# Patient Record
Sex: Male | Born: 1945 | Race: White | Hispanic: No | Smoking: Never smoker
Health system: Southern US, Community
[De-identification: ages and names within clinical notes are randomized; demographics above are authoritative.]

## PROBLEM LIST (undated history)

## (undated) DIAGNOSIS — C61 Malignant neoplasm of prostate: Secondary | ICD-10-CM

## (undated) DIAGNOSIS — E119 Type 2 diabetes mellitus without complications: Secondary | ICD-10-CM

## (undated) DIAGNOSIS — E7800 Pure hypercholesterolemia, unspecified: Secondary | ICD-10-CM

## (undated) HISTORY — DX: Malignant neoplasm of prostate: C61

---

## 2008-12-06 DEATH — deceased

## 2011-02-15 ENCOUNTER — Ambulatory Visit: Payer: Self-pay | Admitting: Family Medicine

## 2011-03-12 ENCOUNTER — Encounter: Payer: Self-pay | Admitting: Family Medicine

## 2011-03-12 ENCOUNTER — Other Ambulatory Visit: Payer: Self-pay | Admitting: Family Medicine

## 2011-03-12 ENCOUNTER — Ambulatory Visit (INDEPENDENT_AMBULATORY_CARE_PROVIDER_SITE_OTHER): Payer: 59 | Admitting: Family Medicine

## 2011-03-12 DIAGNOSIS — E785 Hyperlipidemia, unspecified: Secondary | ICD-10-CM

## 2011-03-12 DIAGNOSIS — E119 Type 2 diabetes mellitus without complications: Secondary | ICD-10-CM

## 2011-03-12 DIAGNOSIS — K219 Gastro-esophageal reflux disease without esophagitis: Secondary | ICD-10-CM

## 2011-03-12 DIAGNOSIS — N400 Enlarged prostate without lower urinary tract symptoms: Secondary | ICD-10-CM

## 2011-03-12 MED ORDER — ESOMEPRAZOLE MAGNESIUM 40 MG PO CPDR: 40.0000 mg | DELAYED_RELEASE_CAPSULE | Freq: Every day | ORAL | Status: DC

## 2011-03-12 NOTE — Patient Instructions (Signed)
We will call you once I get your records and can go through them

## 2011-03-13 ENCOUNTER — Telehealth: Payer: Self-pay | Admitting: *Deleted

## 2011-03-13 DIAGNOSIS — E119 Type 2 diabetes mellitus without complications: Secondary | ICD-10-CM | POA: Insufficient documentation

## 2011-03-13 DIAGNOSIS — E785 Hyperlipidemia, unspecified: Secondary | ICD-10-CM | POA: Insufficient documentation

## 2011-03-13 DIAGNOSIS — K219 Gastro-esophageal reflux disease without esophagitis: Secondary | ICD-10-CM | POA: Insufficient documentation

## 2011-03-13 DIAGNOSIS — N400 Enlarged prostate without lower urinary tract symptoms: Secondary | ICD-10-CM | POA: Insufficient documentation

## 2011-03-13 NOTE — Progress Notes (Signed)
  Subjective:    Patient ID: Rickey Roberts, male    DOB: 19-Aug-1946, 65 y.o.   MRN: [id number]  HPI Patient is here to establish care. He says the last time he saw his regular physician was approximately 2 years ago. At that time they were doing something with his thyroid and he was supposed to be scheduled for surgery. He did end up seeing a physician [name] at that time I think something happened with his insurance and he was unable to have the surgery. As patient describes his previous concerns I must wonder if he is talking about his parathyroid glands. We will certainly try to get a copy of his old records to see exactly what is needed. He did say he recently went to an urgent care type facility [place] and did have some lab work drawn which he says should be sent up here. Otherwise he is feeling okay and he does not have any other particular concerns.  Diabetes- He did note he is used to be on metformin 1000 twice a day but recently after being restarted on his metformin is only taking 1500 mg daily.   Review of Systems     Objective:   Physical Exam  Constitutional: He is oriented to person, place, and time. He appears well-developed and well-nourished.  HENT:  Head: Normocephalic and atraumatic.  Eyes: Conjunctivae and EOM are normal. Pupils are equal, round, and reactive to light.  Neck: Neck supple. No thyromegaly present.  Cardiovascular: Normal rate, regular rhythm and normal heart sounds.        No carotid bruits, radial pulse 2+ bilaterally.  Pulmonary/Chest: Effort normal and breath sounds normal.  Musculoskeletal: He exhibits no edema.  Lymphadenopathy:    He has no cervical adenopathy.  Neurological: He is alert and oriented to person, place, and time.  Skin: Skin is warm. No rash noted.  Psychiatric: He has a normal mood and affect. His behavior is normal.          Assessment & Plan:  She is talking about his thyroid or possibly even his parathyroids.  [facility] records and get him scheduled immediately for whatever is the next Depo-Provera every needs whether that seeing a surgeon again repeat imaging since it has been 2 years. The patient has signed a release form today so we should hopefully get that promptly. His blood pressure is borderline. It really needs to be little bit better controlled since he is a diabetic but certainly we can work on this. I also don't know the exact dosages of his medications and he said he will get a copy of this to [place], so that we can make adjustments. Since he only recently restarted his medications we may need to do some blood work in the next 2-3 months to make sure that his A1c is at goal and his cholesterol is well controlled.  GERD he did asked for refill on his Nexium I did send her a prescription for this.  Note: Additional medical hx if not included at pt forgot to leave his new pt form.

## 2011-03-13 NOTE — Telephone Encounter (Signed)
Yes, OK to change to omeprazole

## 2011-03-13 NOTE — Telephone Encounter (Signed)
Called and changed rx with pharm

## 2011-03-13 NOTE — Telephone Encounter (Signed)
[facility] called and wanted to know if we can writes a different rx for pt because insurance wont cover nexium

## 2011-04-03 ENCOUNTER — Telehealth: Payer: Self-pay | Admitting: Family Medicine

## 2011-04-03 NOTE — Telephone Encounter (Signed)
Call pt" I got his old records. We do need to recheck his PTH and calcium and vit D, and see where his levels are. If still abnormal then will need to get him referred by to [facility].

## 2011-04-04 ENCOUNTER — Other Ambulatory Visit: Payer: 59

## 2011-04-04 DIAGNOSIS — R899 Unspecified abnormal finding in specimens from other organs, systems and tissues: Secondary | ICD-10-CM

## 2011-04-04 NOTE — Telephone Encounter (Signed)
Left message on vm and printed and faxed lab orders to lab.

## 2011-04-24 ENCOUNTER — Other Ambulatory Visit: Payer: Self-pay | Admitting: Family Medicine

## 2011-06-04 ENCOUNTER — Ambulatory Visit: Payer: 59 | Admitting: Family Medicine

## 2011-06-04 ENCOUNTER — Other Ambulatory Visit: Payer: Self-pay | Admitting: Family Medicine

## 2011-06-05 ENCOUNTER — Telehealth: Payer: Self-pay | Admitting: Family Medicine

## 2011-06-05 NOTE — Telephone Encounter (Signed)
Pharmacist [facility] for the triage nurse asking to get 90 day supply for pt meds:  Simvastatin, flomax, and metformin. Plan:  Reviewed the pt chart, called the pharmacist back, and told him pt was told at last office visit two mths ago he would need to return in 2 mths which is due now for office visit and labs and complete list of medications with dosing instructions because provider said since not controlled may need adjustments. Pharmacist will tell the pt and it looks like pt already was given refills enough for 30 day supplies of these meds. Jarvis Newcomer, LPN [facility]

## 2011-07-18 ENCOUNTER — Other Ambulatory Visit: Payer: Self-pay | Admitting: Family Medicine

## 2011-08-17 ENCOUNTER — Other Ambulatory Visit: Payer: Self-pay | Admitting: Family Medicine

## 2011-09-14 ENCOUNTER — Other Ambulatory Visit: Payer: Self-pay | Admitting: Family Medicine

## 2011-10-16 ENCOUNTER — Other Ambulatory Visit: Payer: Self-pay | Admitting: Family Medicine

## 2011-11-13 ENCOUNTER — Other Ambulatory Visit: Payer: Self-pay | Admitting: Family Medicine

## 2011-12-16 ENCOUNTER — Other Ambulatory Visit: Payer: Self-pay | Admitting: Family Medicine

## 2011-12-17 NOTE — Telephone Encounter (Signed)
Must make appointment 

## 2012-01-14 ENCOUNTER — Other Ambulatory Visit: Payer: Self-pay | Admitting: Family Medicine

## 2015-04-19 ENCOUNTER — Emergency Department (HOSPITAL_BASED_OUTPATIENT_CLINIC_OR_DEPARTMENT_OTHER): Payer: Medicare Other

## 2015-04-19 ENCOUNTER — Emergency Department (HOSPITAL_BASED_OUTPATIENT_CLINIC_OR_DEPARTMENT_OTHER)
Admission: EM | Admit: 2015-04-19 | Discharge: 2015-04-19 | Disposition: A | Discharge status: Home / Self Care | Payer: Medicare Other | Attending: Emergency Medicine | Admitting: Emergency Medicine

## 2015-04-19 ENCOUNTER — Encounter (HOSPITAL_BASED_OUTPATIENT_CLINIC_OR_DEPARTMENT_OTHER): Payer: Self-pay

## 2015-04-19 DIAGNOSIS — R51 Headache: Secondary | ICD-10-CM | POA: Insufficient documentation

## 2015-04-19 DIAGNOSIS — E119 Type 2 diabetes mellitus without complications: Secondary | ICD-10-CM | POA: Diagnosis not present

## 2015-04-19 DIAGNOSIS — H5712 Ocular pain, left eye: Secondary | ICD-10-CM | POA: Diagnosis not present

## 2015-04-19 DIAGNOSIS — Z7982 Long term (current) use of aspirin: Secondary | ICD-10-CM | POA: Diagnosis not present

## 2015-04-19 DIAGNOSIS — Z79899 Other long term (current) drug therapy: Secondary | ICD-10-CM | POA: Diagnosis not present

## 2015-04-19 DIAGNOSIS — E780 Pure hypercholesterolemia: Secondary | ICD-10-CM | POA: Insufficient documentation

## 2015-04-19 DIAGNOSIS — Z8546 Personal history of malignant neoplasm of prostate: Secondary | ICD-10-CM | POA: Diagnosis not present

## 2015-04-19 DIAGNOSIS — R519 Headache, unspecified: Secondary | ICD-10-CM

## 2015-04-19 HISTORY — DX: Type 2 diabetes mellitus without complications: E11.9

## 2015-04-19 HISTORY — DX: Pure hypercholesterolemia, unspecified: E78.00

## 2015-04-19 LAB — COMPREHENSIVE METABOLIC PANEL
ALK PHOS: 44 U/L (ref 38–126)
ALT: 17 U/L (ref 17–63)
AST: 20 U/L (ref 15–41)
Albumin: 4.2 g/dL (ref 3.5–5.0)
Anion gap: 6 (ref 5–15)
BILIRUBIN TOTAL: 1.1 mg/dL (ref 0.3–1.2)
BUN: 18 mg/dL (ref 6–20)
CHLORIDE: 103 mmol/L (ref 101–111)
CO2: 29 mmol/L (ref 22–32)
Calcium: 10.3 mg/dL (ref 8.9–10.3)
Creatinine, Ser: 0.96 mg/dL (ref 0.61–1.24)
GFR calc Af Amer: >60 mL/min (ref >60)
GFR calc non Af Amer: >60 mL/min (ref >60)
Glucose, Bld: 130 mg/dL — ABNORMAL HIGH (ref 65–99)
Potassium: 4 mmol/L (ref 3.5–5.1)
SODIUM: 138 mmol/L (ref 135–145)
TOTAL PROTEIN: 7.8 g/dL (ref 6.5–8.1)

## 2015-04-19 LAB — CBC WITH DIFFERENTIAL/PLATELET
BASOS ABS: 0 10*3/uL (ref 0.0–0.1)
Basophils Relative: 0 % (ref 0–1)
EOS ABS: 0.1 10*3/uL (ref 0.0–0.7)
EOS PCT: 1 % (ref 0–5)
HCT: 43 % (ref 39.0–52.0)
HEMOGLOBIN: 14.4 g/dL (ref 13.0–17.0)
Lymphocytes Relative: 15 % (ref 12–46)
Lymphs Abs: 1.2 10*3/uL (ref 0.7–4.0)
MCH: 29.8 pg (ref 26.0–34.0)
MCHC: 33.5 g/dL (ref 30.0–36.0)
MCV: 89 fL (ref 78.0–100.0)
MONO ABS: 0.5 10*3/uL (ref 0.1–1.0)
Monocytes Relative: 6 % (ref 3–12)
NEUTROS PCT: 78 % — AB (ref 43–77)
Neutro Abs: 6.5 10*3/uL (ref 1.7–7.7)
PLATELETS: 265 10*3/uL (ref 150–400)
RBC: 4.83 MIL/uL (ref 4.22–5.81)
RDW: 13.7 % (ref 11.5–15.5)
WBC: 8.3 10*3/uL (ref 4.0–10.5)

## 2015-04-19 LAB — SEDIMENTATION RATE: Sed Rate: 3 mm/hr (ref 0–16)

## 2015-04-19 MED ORDER — HYDROCODONE-ACETAMINOPHEN 5-325 MG PO TABS
ORAL_TABLET | ORAL | Status: AC
  Administered 2015-04-19: 1
  Filled 2015-04-19: qty 1

## 2015-04-19 MED ORDER — TETRACAINE HCL 0.5 % OP SOLN
OPHTHALMIC | Status: AC
  Filled 2015-04-19: qty 2

## 2015-04-19 MED ORDER — HYDROCODONE-ACETAMINOPHEN 5-325 MG PO TABS: 1.0000 | ORAL_TABLET | Freq: Four times a day (QID) | ORAL | Status: AC | PRN

## 2015-04-19 NOTE — ED Notes (Signed)
Pt c/o headache x3wks, states getting worse tonight, pain over lt eye area

## 2015-04-19 NOTE — ED Provider Notes (Signed)
CSN: [id number]     Arrival date & time 04/19/15  3825 History   First MD Initiated Contact with Patient 04/19/15 0353     Chief Complaint  Patient presents with  . Headache     (Consider location/radiation/quality/duration/timing/severity/associated sxs/prior Treatment) HPI  This is a 69 year old male who complains of a headache for the past 3 weeks. The pain is come on gradually and is worsening. It is now severe enough that it prevents him from sleeping. He localizes the pain to the left eye. It is as if his left globe hurts. His vision is not changed. There is no facial tenderness. The pain is constant and not relieved by over-the-counter analgesics. He denies nausea or vomiting. He denies neurologic changes. He has a remote history of left tympanic membrane rupture.  Past Medical History  Diagnosis Date  . Prostate cancer     radiation  . High cholesterol   . Diabetes mellitus without complication    History reviewed. No pertinent past surgical history. No family history on file. History  Substance Use Topics  . Smoking status: Never Smoker   . Smokeless tobacco: Not on file  . Alcohol Use: No    Review of Systems  All other systems reviewed and are negative.   Allergies  Review of patient's allergies indicates no known allergies.  Home Medications   Prior to Admission medications   Medication Sig Start Date End Date Taking? Authorizing Provider  aspirin 81 MG chewable tablet Chew by mouth daily.   Yes Historical Provider, MD  glipiZIDE (GLUCOTROL) 10 MG tablet Take 10 mg by mouth daily before breakfast.   Yes Historical Provider, MD  losartan (COZAAR) 25 MG tablet Take 25 mg by mouth daily.   Yes Historical Provider, MD  metFORMIN (GLUCOPHAGE) 500 MG tablet Take 500 mg by mouth 3 (three) times daily.      Historical Provider, MD  metFORMIN (GLUCOPHAGE-XR) 500 MG 24 hr tablet TAKE ONE TABLET BY MOUTH TWICE DAILY 01/14/12   Hali Marry, MD  NEXIUM 40 MG  capsule TAKE ONE CAPSULE BY MOUTH DAILY 03/12/11   Hali Marry, MD  simvastatin (ZOCOR) 40 MG tablet TAKE 1 TABLET BY MOUTH EVERY DAY 06/04/11   Hali Marry, MD  SIMVASTATIN PO Take by mouth.      Historical Provider, MD  Tamsulosin HCl (FLOMAX PO) Take by mouth.      Historical Provider, MD  Tamsulosin HCl (FLOMAX) 0.4 MG CAPS TAKE ONE CAPSULE BY MOUTH EVERY NIGHT AT BEDTIME 06/04/11   Hali Marry, MD   BP 169/85 mmHg  Pulse 74  Temp(Src) 98.4 F (36.9 C) (Oral)  Resp 20  Ht 5\' 8"  (1.727 m)  Wt 176 lb (79.833 kg)  BMI 26.77 kg/m2  SpO2 98%   Physical Exam  General: Well-developed, well-nourished male in no acute distress; appearance consistent with age of record HENT: normocephalic; atraumatic; no tenderness over temporal arteries; chronic appearing perforation of left TM Eyes: pupils equal, round and reactive to light; extraocular muscles intact; no tenderness of globes; IOP 70mmHg bilaterally Neck: supple Heart: regular rate and rhythm Lungs: clear to auscultation bilaterally Abdomen: soft; nondistended; nontender; no masses or hepatosplenomegaly; bowel sounds present Extremities: No deformity; full range of motion; pulses normal Neurologic: Awake, alert and oriented; motor function intact in all extremities and symmetric; no facial droop Skin: Warm and dry Psychiatric: Normal mood and affect    ED Course  Procedures (including critical care time)   MDM  Nursing notes and vitals signs, including pulse oximetry, reviewed.  Summary of this visit's results, reviewed by myself:  Labs:  Results for orders placed or performed during the hospital encounter of 04/19/15 (from the past 24 hour(s))  Comprehensive metabolic panel     Status: Abnormal   Collection Time: 04/19/15  4:39 AM  Result Value Ref Range   Sodium 138 135 - 145 mmol/L   Potassium 4.0 3.5 - 5.1 mmol/L   Chloride 103 101 - 111 mmol/L   CO2 29 22 - 32 mmol/L   Glucose, Bld 130 (H) 65 -  99 mg/dL   BUN 18 6 - 20 mg/dL   Creatinine, Ser 0.96 0.61 - 1.24 mg/dL   Calcium 10.3 8.9 - 10.3 mg/dL   Total Protein 7.8 6.5 - 8.1 g/dL   Albumin 4.2 3.5 - 5.0 g/dL   AST 20 15 - 41 U/L   ALT 17 17 - 63 U/L   Alkaline Phosphatase 44 38 - 126 U/L   Total Bilirubin 1.1 0.3 - 1.2 mg/dL   GFR calc non Af Amer >60 >60 mL/min   GFR calc Af Amer >60 >60 mL/min   Anion gap 6 5 - 15  CBC with Differential/Platelet     Status: Abnormal   Collection Time: 04/19/15  4:39 AM  Result Value Ref Range   WBC 8.3 4.0 - 10.5 K/uL   RBC 4.83 4.22 - 5.81 MIL/uL   Hemoglobin 14.4 13.0 - 17.0 g/dL   HCT 43.0 39.0 - 52.0 %   MCV 89.0 78.0 - 100.0 fL   MCH 29.8 26.0 - 34.0 pg   MCHC 33.5 30.0 - 36.0 g/dL   RDW 13.7 11.5 - 15.5 %   Platelets 265 150 - 400 K/uL   Neutrophils Relative % 78 (H) 43 - 77 %   Neutro Abs 6.5 1.7 - 7.7 K/uL   Lymphocytes Relative 15 12 - 46 %   Lymphs Abs 1.2 0.7 - 4.0 K/uL   Monocytes Relative 6 3 - 12 %   Monocytes Absolute 0.5 0.1 - 1.0 K/uL   Eosinophils Relative 1 0 - 5 %   Eosinophils Absolute 0.1 0.0 - 0.7 K/uL   Basophils Relative 0 0 - 1 %   Basophils Absolute 0.0 0.0 - 0.1 K/uL  Sedimentation rate     Status: None   Collection Time: 04/19/15  4:39 AM  Result Value Ref Range   Sed Rate 3 0 - 16 mm/hr    Imaging Studies: Ct Head Wo Contrast  04/19/2015   CLINICAL DATA:  Left eye pain and headache for 3 weeks. Nausea and vomiting tonight.  EXAM: CT HEAD WITHOUT CONTRAST  TECHNIQUE: Contiguous axial images were obtained from the base of the skull through the vertex without intravenous contrast.  COMPARISON:  None.  FINDINGS: Diffuse cerebral atrophy. Low-attenuation changes in the deep Brenn matter consistent with small vessel ischemia. Calcification in the high medial gland. Focal subcutaneous scalp lesion in the right posterior parietal region with calcification. This is probably a sebaceous cyst. No mass effect or midline shift. No abnormal extra-axial fluid  collections. Gray-[place]is[place] matter junctions are distinct. Basal cisterns are not effaced. No evidence of acute intracranial hemorrhage. No depressed skull fractures. Visualized paranasal sinuses and mastoid air cells are not opacified.  IMPRESSION: No acute intracranial abnormalities. Chronic atrophy and small vessel ischemic changes.   Electronically Signed   By: Lucienne Capers M.D.   On: 04/19/2015 04:19   5:56 AM No relief  with oxygen by nonrebreather; constant nature of headache is not consistent with cluster headaches although the location was suggestive. Sedimentation rate is 3 making giant cell arteritis highly unlikely. Intraocular pressures were normal. He has a follow-up appointment with his ophthalmologist next week. We will treat him with analgesics and he will also follow-up with his primary care physician.    Shanon Rosser, MD 04/19/15 [phone]

## 2016-09-09 IMAGING — CT CT HEAD W/O CM
1 series · 16 of 30 positions shown, 20 images · non-contrast
Comparison: None.

CLINICAL DATA: Left eye pain and headache for 3 weeks. Nausea and
vomiting tonight.

EXAM:
CT HEAD WITHOUT CONTRAST
TECHNIQUE: Contiguous axial images were obtained from the base of the skull
through the vertex without intravenous contrast.

[Series 2: head 4.8 h37s · axial · 0.45mm/px · z∈[-51,+85]mm · 16 of 32 slices shown, 20 images]
[im 2/32  brain]
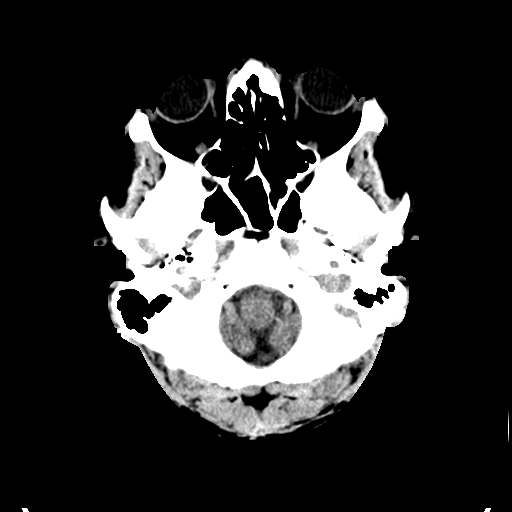
[im 2/32  bone]
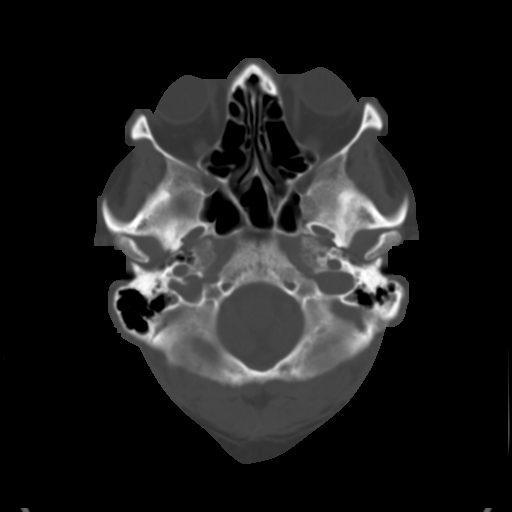
[im 4/32  brain]
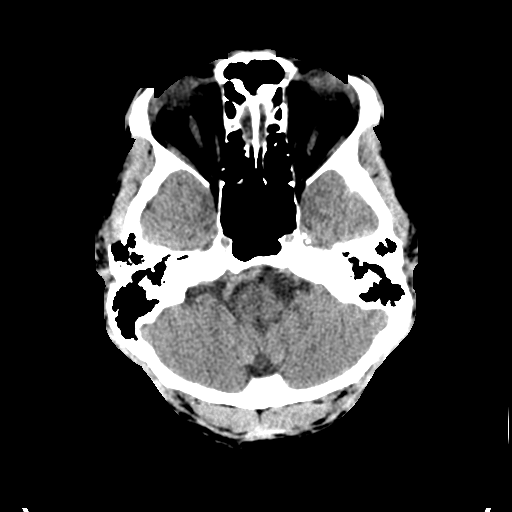
[im 6/32  brain]
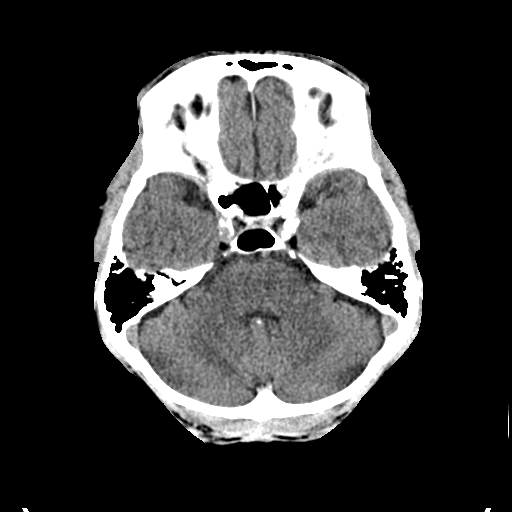
[im 8/32  brain]
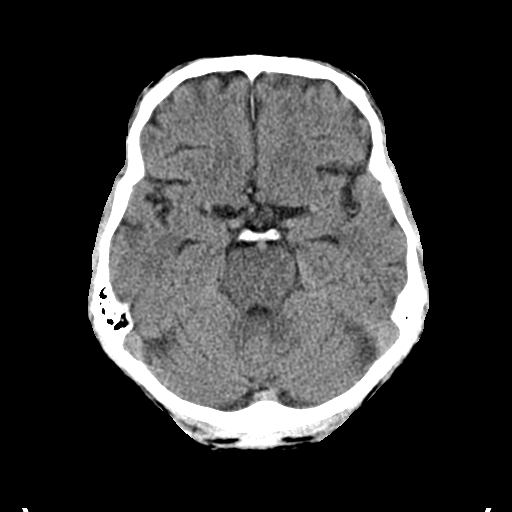
[im 9/32  brain]
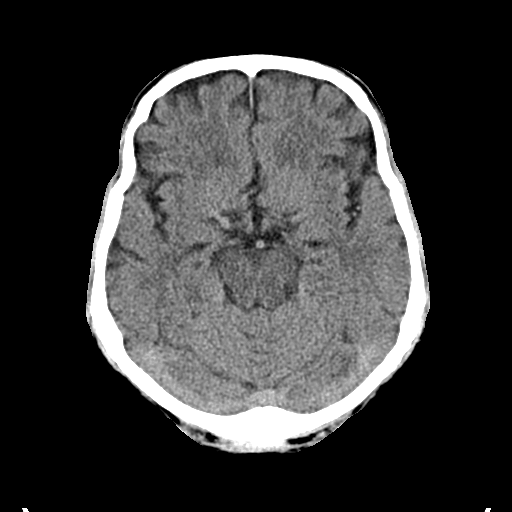
[im 9/32  bone]
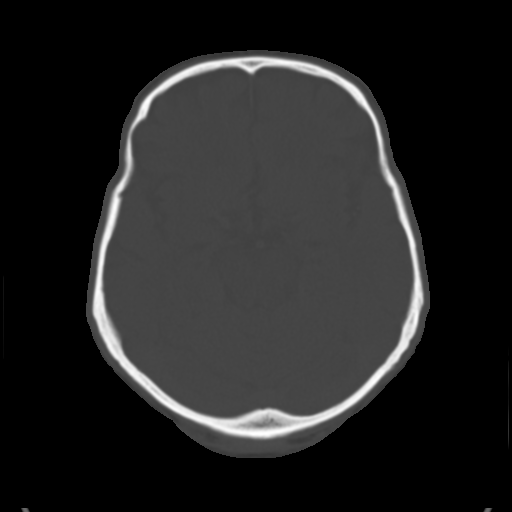
[im 11/32  brain]
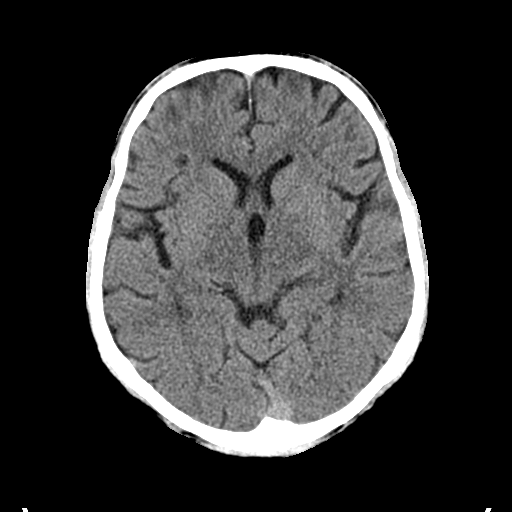
[im 13/32  brain]
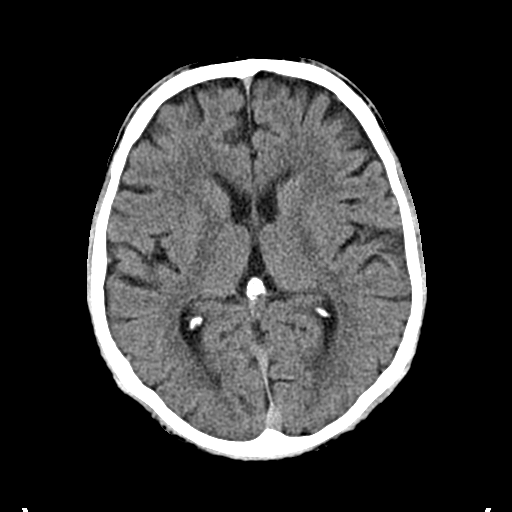
[im 15/32  brain]
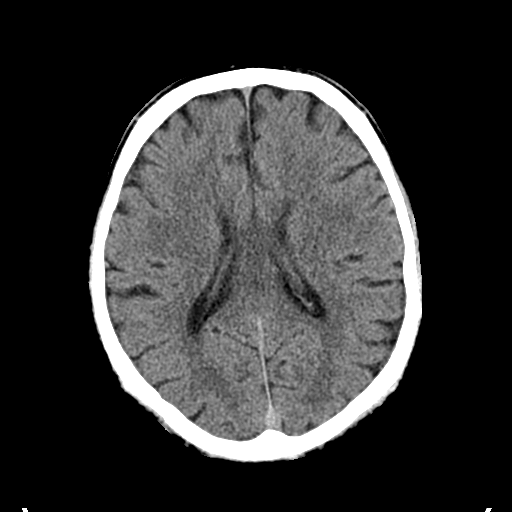
[im 17/32  brain]
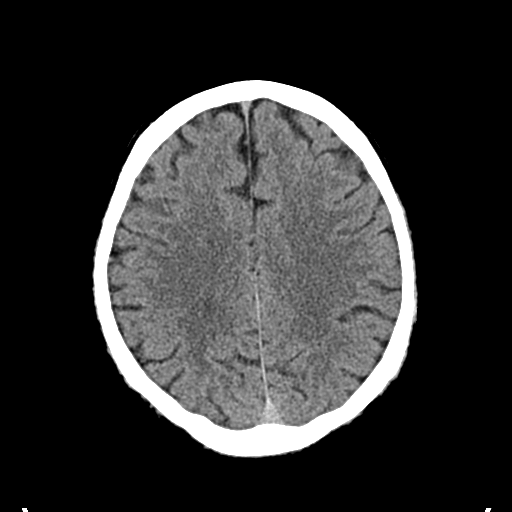
[im 17/32  bone]
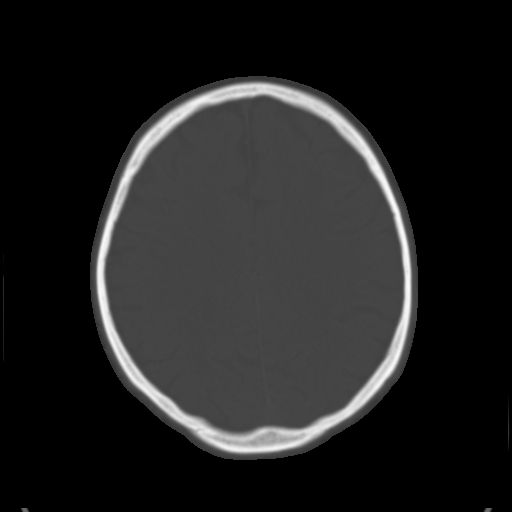
[im 19/32  brain]
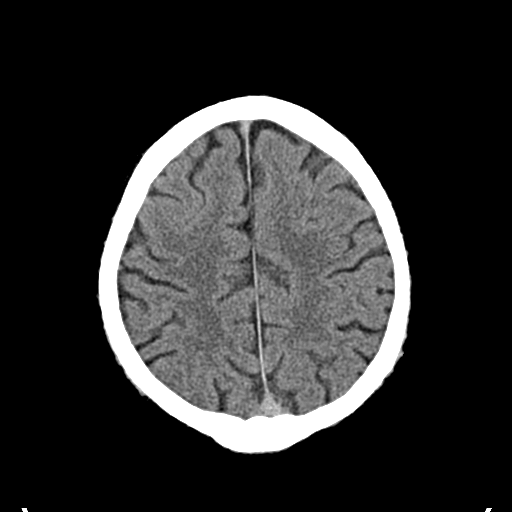
[im 21/32  brain]
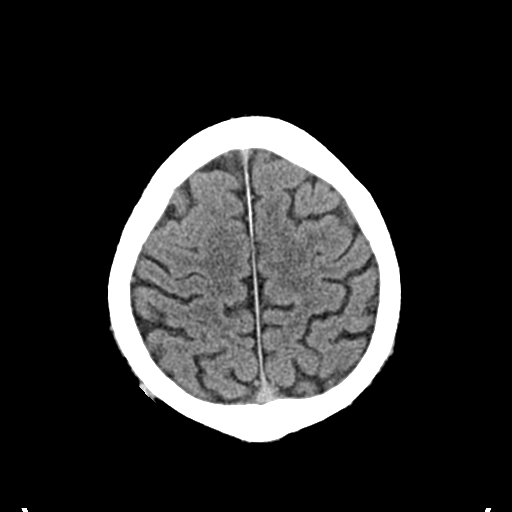
[im 23/32  brain]
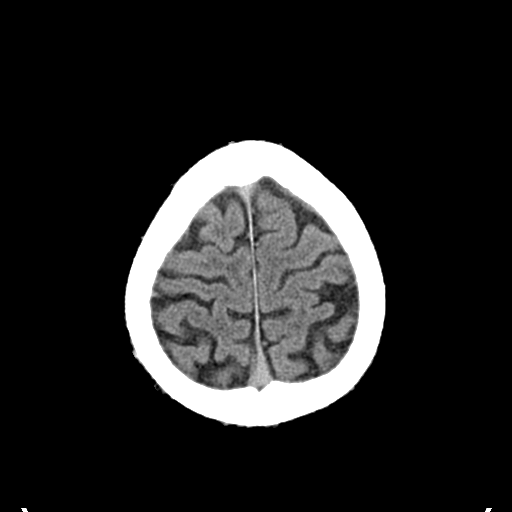
[im 24/32  brain]
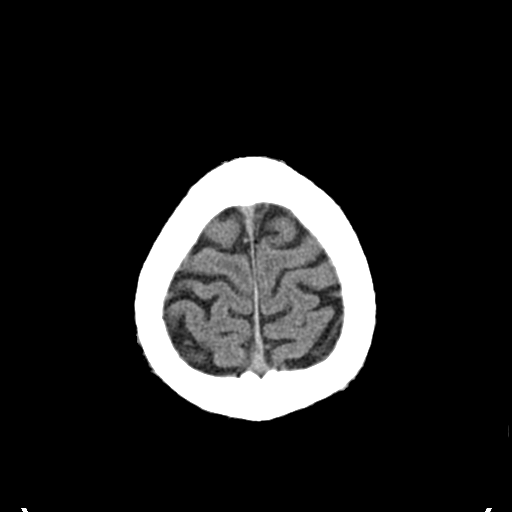
[im 24/32  bone]
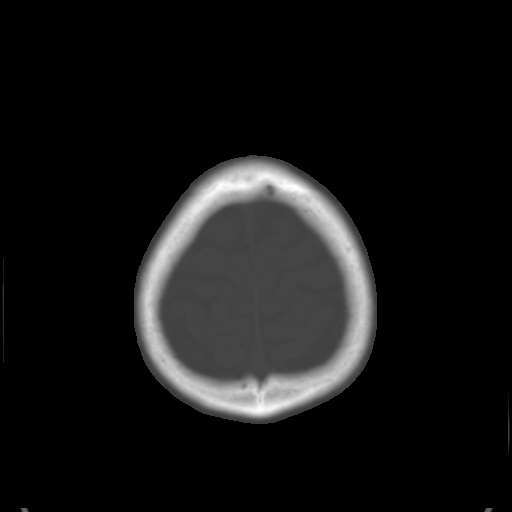
[im 26/32  brain]
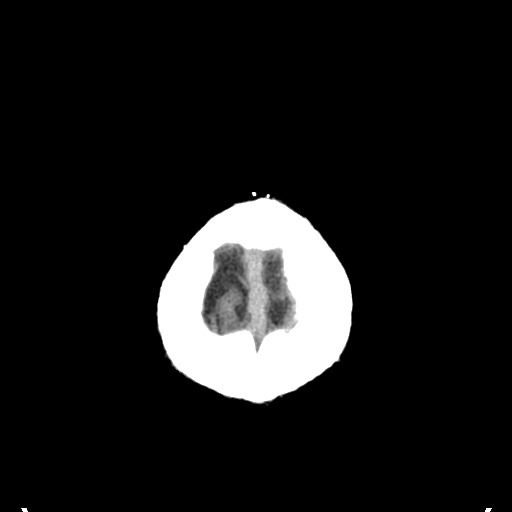
[im 28/32  brain]
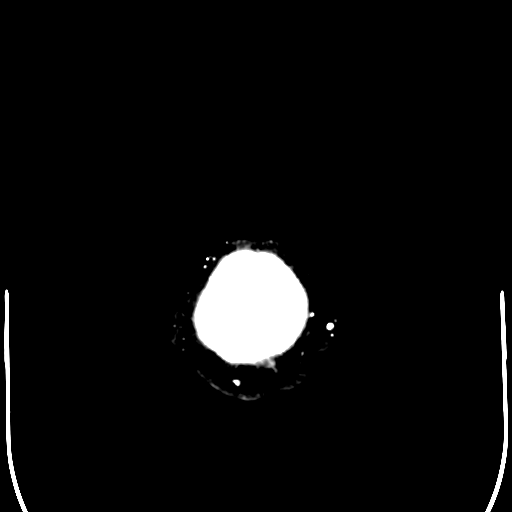
[im 30/32  brain]
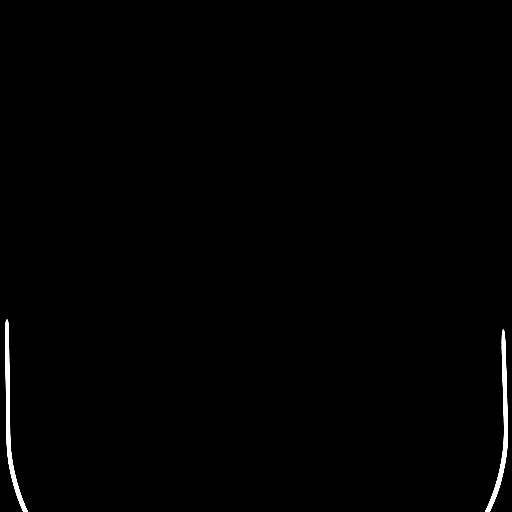

[16 of 30 positions shown; findings below may reference images not displayed]

FINDINGS: Diffuse cerebral atrophy. Low-attenuation changes in the deep white
matter consistent with small vessel ischemia. Calcification in the
high medial gland. Focal subcutaneous scalp lesion in the right
posterior parietal region with calcification. This is probably a
sebaceous cyst. No mass effect or midline shift. No abnormal
extra-axial fluid collections. Gray-white matter junctions are
distinct. Basal cisterns are not effaced. No evidence of acute
intracranial hemorrhage. No depressed skull fractures. Visualized
paranasal sinuses and mastoid air cells are not opacified.
IMPRESSION: No acute intracranial abnormalities. Chronic atrophy and small
vessel ischemic changes.
